# Patient Record
Sex: Male | Born: 1985 | Race: White | Hispanic: No | Marital: Married | Smoking: Never smoker
Health system: Southern US, Community
[De-identification: ages and names within clinical notes are randomized; demographics above are authoritative.]

---

## 2012-10-22 ENCOUNTER — Ambulatory Visit: Payer: Self-pay | Admitting: Family Medicine

## 2015-11-20 ENCOUNTER — Ambulatory Visit (INDEPENDENT_AMBULATORY_CARE_PROVIDER_SITE_OTHER): Payer: 59

## 2015-11-20 ENCOUNTER — Encounter: Payer: Self-pay | Admitting: Gynecology

## 2015-11-20 ENCOUNTER — Ambulatory Visit
Admission: EM | Admit: 2015-11-20 | Discharge: 2015-11-20 | Disposition: A | Discharge status: Home / Self Care | Payer: 59 | Attending: Family Medicine | Admitting: Family Medicine

## 2015-11-20 DIAGNOSIS — J209 Acute bronchitis, unspecified: Secondary | ICD-10-CM

## 2015-11-20 DIAGNOSIS — R0602 Shortness of breath: Secondary | ICD-10-CM

## 2015-11-20 DIAGNOSIS — H66001 Acute suppurative otitis media without spontaneous rupture of ear drum, right ear: Secondary | ICD-10-CM

## 2015-11-20 LAB — RAPID INFLUENZA A&B ANTIGENS: Influenza A (ARMC): NOT DETECTED

## 2015-11-20 LAB — RAPID INFLUENZA A&B ANTIGENS (ARMC ONLY): INFLUENZA B (ARMC): NOT DETECTED

## 2015-11-20 MED ORDER — ONDANSETRON 8 MG PO TBDP
8.0000 mg | ORAL_TABLET | Freq: Once | ORAL | Status: AC
  Administered 2015-11-20: 8 mg via ORAL

## 2015-11-20 MED ORDER — ALBUTEROL SULFATE HFA 108 (90 BASE) MCG/ACT IN AERS: 2.0000 | INHALATION_SPRAY | RESPIRATORY_TRACT | Status: DC | PRN

## 2015-11-20 MED ORDER — FEXOFENADINE-PSEUDOEPHED ER 180-240 MG PO TB24: 1.0000 | ORAL_TABLET | Freq: Every day | ORAL | Status: DC

## 2015-11-20 MED ORDER — METHYLPREDNISOLONE SODIUM SUCC 125 MG IJ SOLR
125.0000 mg | Freq: Once | INTRAMUSCULAR | Status: AC
  Administered 2015-11-20: 125 mg via INTRAMUSCULAR

## 2015-11-20 MED ORDER — CEFUROXIME AXETIL 500 MG PO TABS: 500.0000 mg | ORAL_TABLET | Freq: Two times a day (BID) | ORAL | Status: DC

## 2015-11-20 MED ORDER — IPRATROPIUM-ALBUTEROL 0.5-2.5 (3) MG/3ML IN SOLN
3.0000 mL | Freq: Once | RESPIRATORY_TRACT | Status: AC
  Administered 2015-11-20: 3 mL via RESPIRATORY_TRACT

## 2015-11-20 MED ORDER — HYDROCOD POLST-CPM POLST ER 10-8 MG/5ML PO SUER: 5.0000 mL | Freq: Two times a day (BID) | ORAL | Status: DC | PRN

## 2015-11-20 NOTE — Discharge Instructions (Signed)
Otitis Media, Adult Otitis media is redness, soreness, and puffiness (swelling) in the space just behind your eardrum (middle ear). It may be caused by allergies or infection. It often happens along with a cold. HOME CARE  Take your medicine as told. Finish it even if you start to feel better.  Only take over-the-counter or prescription medicines for pain, discomfort, or fever as told by your doctor.  Follow up with your doctor as told. GET HELP IF:  You have otitis media only in one ear, or bleeding from your nose, or both.  You notice a lump on your neck.  You are not getting better in 3-5 days.  You feel worse instead of better. GET HELP RIGHT AWAY IF:   You have pain that is not helped with medicine.  You have puffiness, redness, or pain around your ear.  You get a stiff neck.  You cannot move part of your face (paralysis).  You notice that the bone behind your ear hurts when you touch it. MAKE SURE YOU:   Understand these instructions.  Will watch your condition.  Will get help right away if you are not doing well or get worse.   This information is not intended to replace advice given to you by your health care provider. Make sure you discuss any questions you have with your health care provider.   Document Released: 03/08/2008 Document Revised: 10/11/2014 Document Reviewed: 04/17/2013 Elsevier Interactive Patient Education 2016 [employer]  Acute Bronchitis Bronchitis is when the airways that extend from the windpipe into the lungs get red, puffy, and painful (inflamed). Bronchitis often causes thick spit (mucus) to develop. This leads to a cough. A cough is the most common symptom of bronchitis. In acute bronchitis, the condition usually begins suddenly and goes away over time (usually in 2 weeks). Smoking, allergies, and asthma can make bronchitis worse. Repeated episodes of bronchitis may cause more lung problems. HOME CARE  Rest.  Drink enough fluids to  keep your pee (urine) clear or pale yellow (unless you need to limit fluids as told by your doctor).  Only take over-the-counter or prescription medicines as told by your doctor.  Avoid smoking and secondhand smoke. These can make bronchitis worse. If you are a smoker, think about using nicotine gum or skin patches. Quitting smoking will help your lungs heal faster.  Reduce the chance of getting bronchitis again by:  Washing your hands often.  Avoiding people with cold symptoms.  Trying not to touch your hands to your mouth, nose, or eyes.  Follow up with your doctor as told. GET HELP IF: Your symptoms do not improve after 1 week of treatment. Symptoms include:  Cough.  Fever.  Coughing up thick spit.  Body aches.  Chest congestion.  Chills.  Shortness of breath.  Sore throat. GET HELP RIGHT AWAY IF:   You have an increased fever.  You have chills.  You have severe shortness of breath.  You have bloody thick spit (sputum).  You throw up (vomit) often.  You lose too much body fluid (dehydration).  You have a severe headache.  You faint. MAKE SURE YOU:   Understand these instructions.  Will watch your condition.  Will get help right away if you are not doing well or get worse.   This information is not intended to replace advice given to you by your health care provider. Make sure you discuss any questions you have with your health care provider.   Document Released: 03/08/2008  Document Revised: 05/23/2013 Document Reviewed: 03/13/2013 Elsevier Interactive Patient Education [employer]

## 2015-11-20 NOTE — ED Provider Notes (Signed)
CSN: [id number]     Arrival date & time 11/20/15  1821 History   First MD Initiated Contact with Patient 11/20/15 2049    Nurses notes were reviewed. Chief Complaint  Patient presents with  . Cough  . Shortness of Breath   Patient has shortness of breath that started this afternoon while at work. According to his wife he's been sick now for about 3 days everything started on him on Sunday regressed as far as ear pain and now shortness of breath. On Tuesday his right ear started bothering him is at low ahead a lot aching percent postnasal drainage and sinus issues. Yesterday his wifeand having more snoring and difficult to breathing at night, this afternoon he became short of breath. He had a physical last month and pulse ox was 100% then insulin 96 today.  No history history of asthma but he does still at night he never smoked no significant family medical history present. He denies any fever.   (Consider location/radiation/quality/duration/timing/severity/associated sxs/prior Treatment) Patient is a 30 y.o. male presenting with cough and shortness of breath. The history is provided by the patient. No language interpreter was used.  Cough Cough characteristics:  Non-productive Severity:  Moderate Onset quality:  Sudden Timing:  Constant Progression:  Worsening Chronicity:  New Context: upper respiratory infection   Context: not sick contacts and not smoke exposure   Relieved by:  Nothing Exacerbated by: coughing. Ineffective treatments:  None tried Associated symptoms: ear pain, rhinorrhea, shortness of breath, sinus congestion and wheezing   Risk factors: recent infection   Shortness of Breath Associated symptoms: cough, ear pain and wheezing     History reviewed. No pertinent past medical history. History reviewed. No pertinent past surgical history. No family history on file. Social History  Substance Use Topics  . Smoking status: Never Smoker   . Smokeless tobacco: None  .  Alcohol Use: Yes    Review of Systems  HENT: Positive for ear pain and rhinorrhea.   Respiratory: Positive for cough, shortness of breath and wheezing.   All other systems reviewed and are negative.   Allergies  Review of patient's allergies indicates no known allergies.  Home Medications   Prior to Admission medications   Medication Sig Start Date End Date Taking? Authorizing Provider  albuterol (PROVENTIL HFA;VENTOLIN HFA) 108 (90 Base) MCG/ACT inhaler Inhale 2 puffs into the lungs every 4 (four) hours as needed for wheezing or shortness of breath. 11/20/15   Hassan Rowan, MD  cefUROXime (CEFTIN) 500 MG tablet Take 1 tablet (500 mg total) by mouth 2 (two) times daily. 11/20/15   Hassan Rowan, MD  chlorpheniramine-HYDROcodone [facility] PENNKINETIC ER) 10-8 MG/5ML SUER Take 5 mLs by mouth every 12 (twelve) hours as needed for cough. 11/20/15   Hassan Rowan, MD  fexofenadine-pseudoephedrine (ALLEGRA-D ALLERGY & CONGESTION) 180-240 MG 24 hr tablet Take 1 tablet by mouth daily. 11/20/15   Hassan Rowan, MD   Meds Ordered and Administered this Visit   Medications  methylPREDNISolone sodium succinate (SOLU-MEDROL) 125 mg/2 mL injection 125 mg (not administered)  ipratropium-albuterol (DUONEB) 0.5-2.5 (3) MG/3ML nebulizer solution 3 mL (3 mLs Nebulization Given 11/20/15 2147)    BP 103/65 mmHg  Pulse 96  Temp(Src) 99.1 F (37.3 C) (Oral)  Resp 18  Ht 6' (1.829 m)  Wt 185 lb (83.915 kg)  BMI 25.08 kg/m2  SpO2 94% No data found.   Physical Exam  Constitutional: He is oriented to person, place, and time. He appears well-developed and well-nourished.  HENT:  Head: Normocephalic and atraumatic.  Right Ear: Hearing and external ear normal. Tympanic membrane is erythematous and retracted.  Left Ear: Hearing, tympanic membrane, external ear and ear canal normal.  Nose: Mucosal edema and rhinorrhea present. Right sinus exhibits maxillary sinus tenderness. Right sinus exhibits no frontal sinus  tenderness. Left sinus exhibits maxillary sinus tenderness. Left sinus exhibits no frontal sinus tenderness.  Mouth/Throat: Normal dentition. No dental caries. Posterior oropharyngeal erythema present.  Eyes: Conjunctivae are normal. Pupils are equal, round, and reactive to light.  Neck: Normal range of motion. Neck supple.  Cardiovascular: Normal rate and regular rhythm.   Pulmonary/Chest: No respiratory distress. He has decreased breath sounds. He has no wheezes.  Musculoskeletal: Normal range of motion.  Lymphadenopathy:    He has cervical adenopathy.  Neurological: He is alert and oriented to person, place, and time.  Skin: Skin is warm. Rash noted.  Facial seborrheic dermatitis  Psychiatric: He has a normal mood and affect.  Vitals reviewed.   ED Course  Procedures (including critical care time)  Labs Review Labs Reviewed  RAPID INFLUENZA A&B ANTIGENS [facility] ONLY)    Imaging Review Dg Chest 2 View  11/20/2015  CLINICAL DATA:  Shortness of breath. EXAM: CHEST  2 VIEW COMPARISON:  None. FINDINGS: The heart size and mediastinal contours are within normal limits. Both lungs are clear. No pneumothorax or pleural effusion is noted. The visualized skeletal structures are unremarkable. IMPRESSION: No active cardiopulmonary disease. Electronically Signed   By: Lupita Raider, M.D.   On: 11/20/2015 21:40     Visual Acuity Review  Right Eye Distance:   Left Eye Distance:   Bilateral Distance:    Right Eye Near:   Left Eye Near:    Bilateral Near:      Results for orders placed or performed during the hospital encounter of 11/20/15  Rapid Influenza A&B Antigens ([facility] only)  Result Value Ref Range   Influenza A ([facility]) NOT DETECTED    Influenza B ([facility]) NOT DETECTED      MDM   1. Acute suppurative otitis media of right ear without spontaneous rupture of tympanic membrane, recurrence not specified   2. Bronchitis, acute, with bronchospasm   3. Shortness of breath       DuoNeb was given with no improvement of the oxygenation level. Because of the poor oxygenation improvement SciMed L1 25 IM was also given. Chest x-ray showed no signs of infiltrate. Will treat bronchitis with Ceftin 500 Tussionex 1 teaspoon twice daily, albuterol inhaler. Also placed on Allegra-D and decongestant. Off work note for tomorrow and have patient follow-up with PCP if not better.    Hassan Rowan, MD 11/20/15 2218

## 2015-11-20 NOTE — ED Notes (Signed)
Patient c/o coughing / shortness of breath/ sneezing and right ear pain.

## 2015-11-20 NOTE — ED Notes (Signed)
Pt with nausea and emesis.

## 2018-07-27 ENCOUNTER — Other Ambulatory Visit: Payer: Self-pay | Admitting: Family Medicine

## 2018-07-27 DIAGNOSIS — R1011 Right upper quadrant pain: Secondary | ICD-10-CM

## 2018-08-02 ENCOUNTER — Ambulatory Visit
Admission: RE | Admit: 2018-08-02 | Discharge: 2018-08-02 | Disposition: A | Discharge status: Home / Self Care | Payer: BLUE CROSS/BLUE SHIELD | Source: Ambulatory Visit | Attending: Family Medicine | Admitting: Family Medicine

## 2018-08-02 DIAGNOSIS — R1011 Right upper quadrant pain: Secondary | ICD-10-CM | POA: Diagnosis not present

## 2018-08-30 ENCOUNTER — Ambulatory Visit: Payer: 59

## 2019-01-07 IMAGING — US US ABDOMEN LIMITED
1 series · 14 of 25 positions shown · non-contrast
Comparison: None.

CLINICAL DATA: 32-year-old male with RIGHT UPPER quadrant abdominal
pain for 2 months.

EXAM:
ULTRASOUND ABDOMEN LIMITED RIGHT UPPER QUADRANT

[Series 1: us abdomen limited · 0.26mm/px · 14 of 49 slices shown]
[im 1/49]
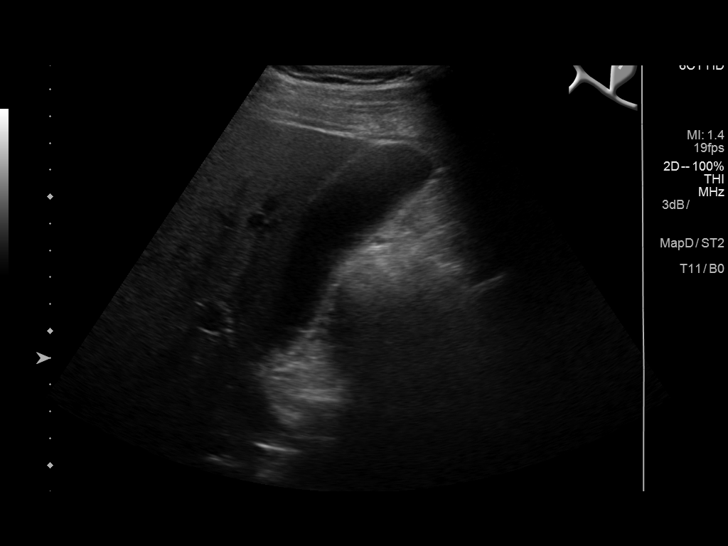
[im 5/49]
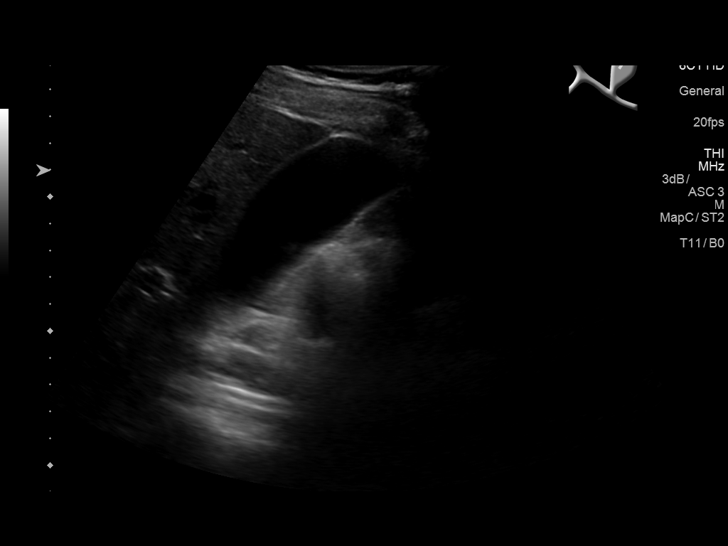
[im 9/49]
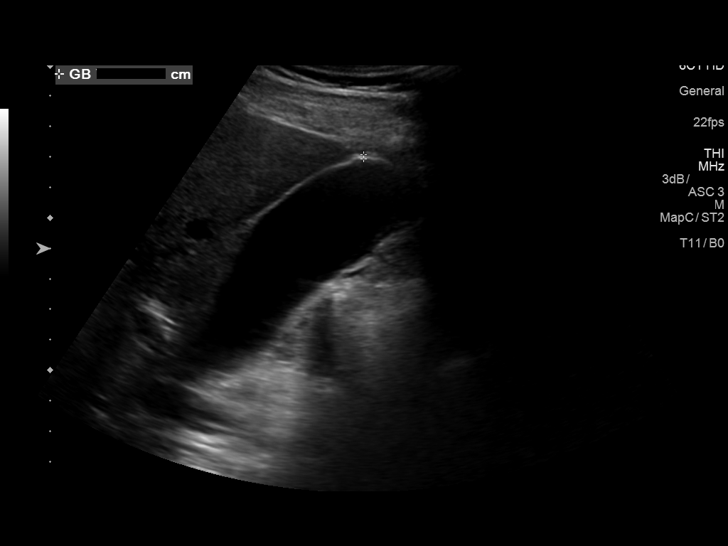
[im 13/49]
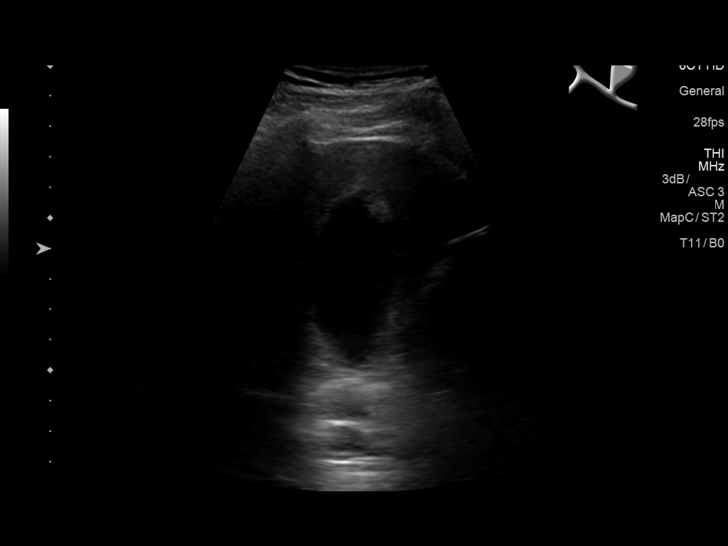
[im 17/49]
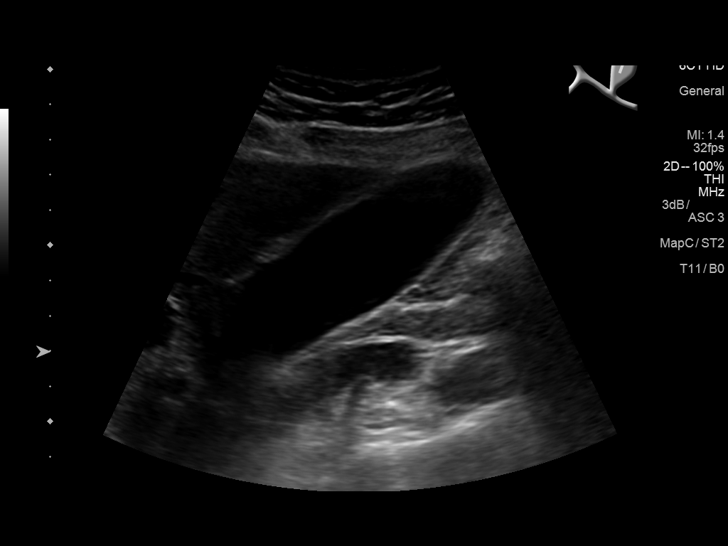
[im 19/49]
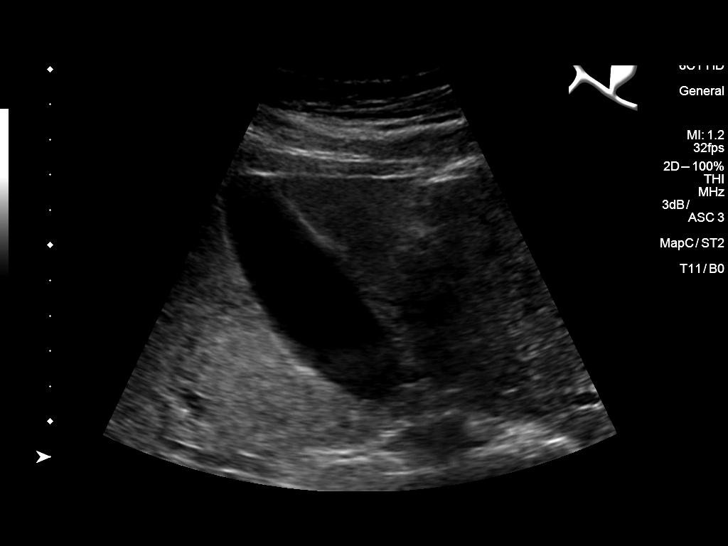
[im 23/49]
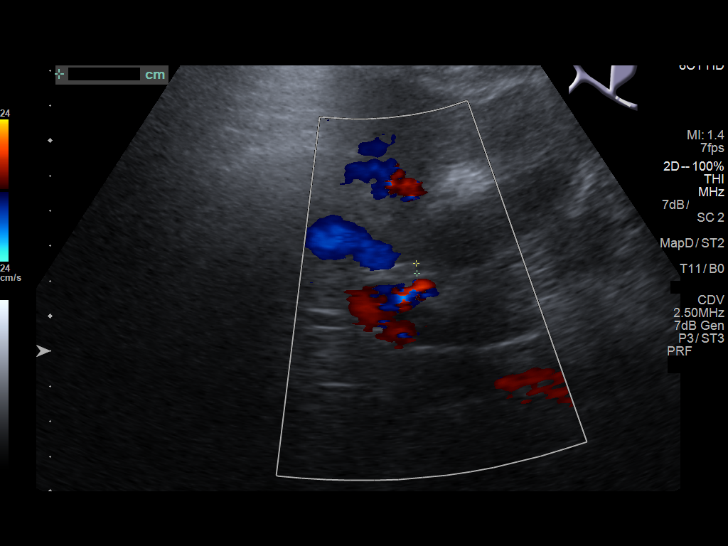
[im 27/49]
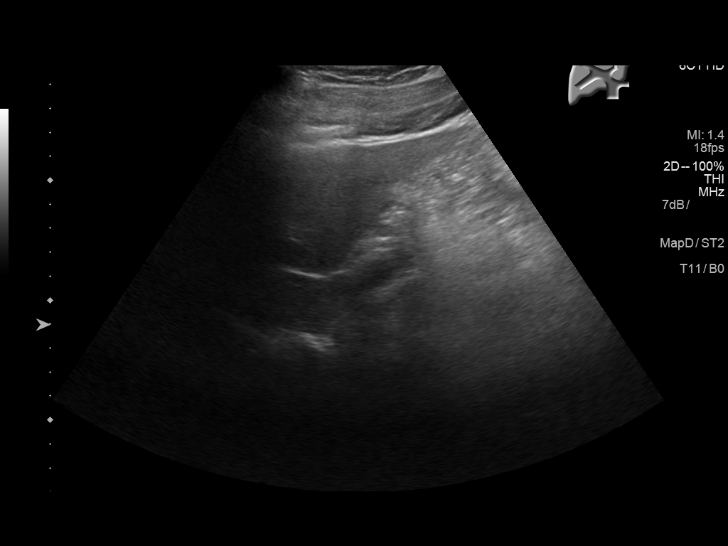
[im 31/49]
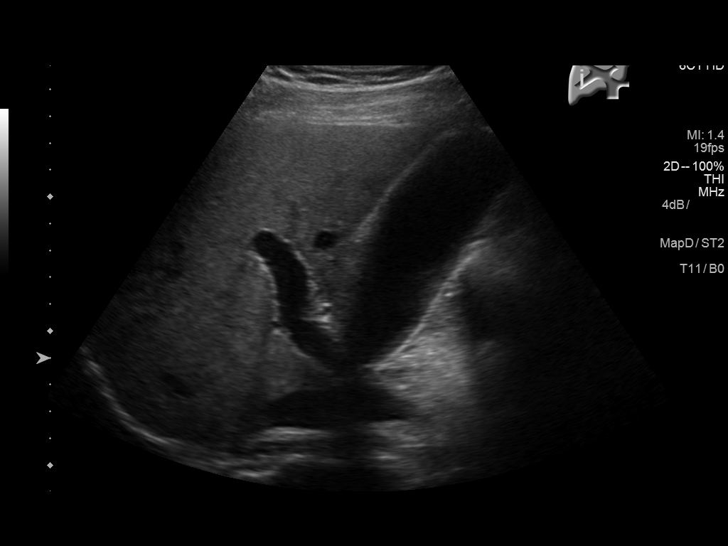
[im 33/49]
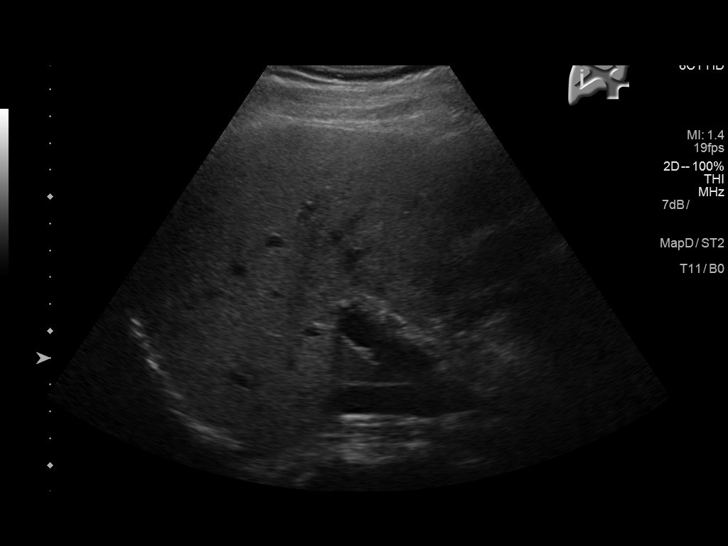
[im 37/49]
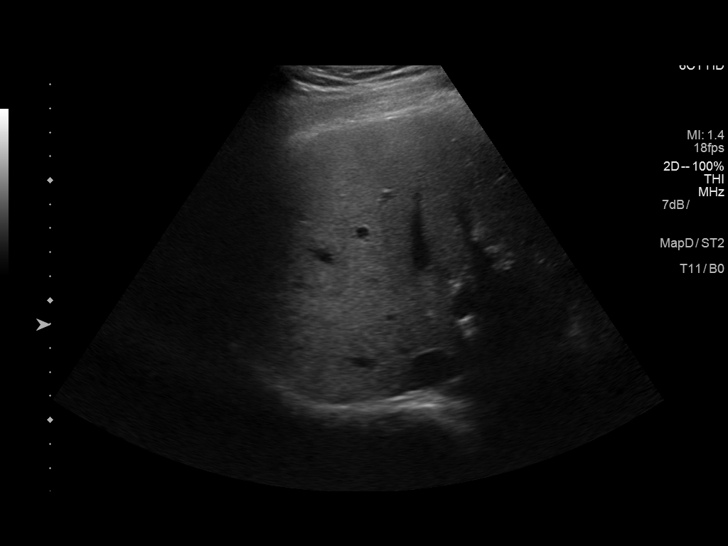
[im 41/49]
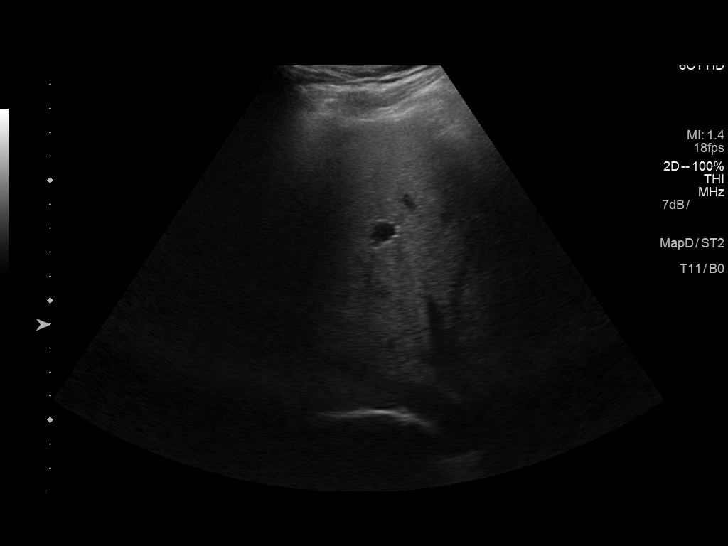
[im 45/49]
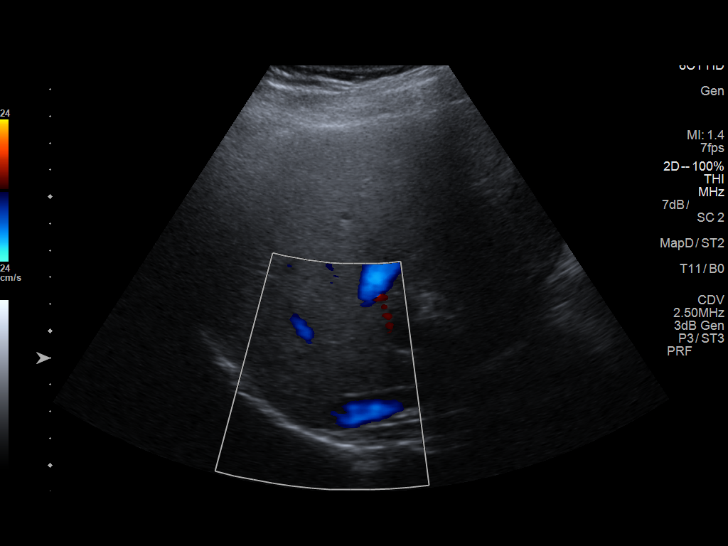
[im 49/49]
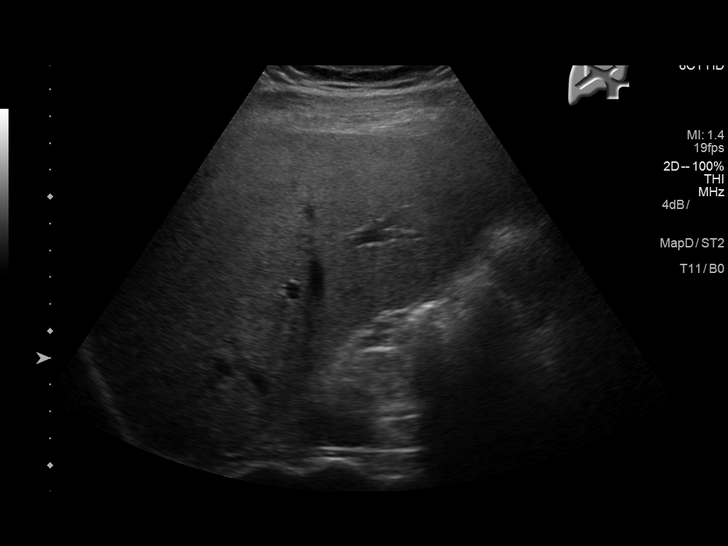

[14 of 25 positions shown; findings below may reference images not displayed]

FINDINGS: Gallbladder:

The gallbladder is unremarkable. There is no evidence of
cholelithiasis or acute cholecystitis.

Common bile duct:

Diameter: 2.8 mm. No intrahepatic or extrahepatic biliary
dilatation.

Liver:

Slightly increased hepatic echogenicity which may represent mild
hepatic steatosis. No focal hepatic abnormalities are identified.
Portal vein is patent on color Doppler imaging with normal direction
of blood flow towards the liver.
IMPRESSION: 1. Possible mild hepatic steatosis. Otherwise unremarkable RIGHT
UPPER quadrant abdominal ultrasound.

## 2020-05-14 ENCOUNTER — Encounter: Payer: Self-pay | Admitting: Emergency Medicine

## 2020-05-14 ENCOUNTER — Ambulatory Visit
Admission: EM | Admit: 2020-05-14 | Discharge: 2020-05-14 | Disposition: A | Discharge status: Home / Self Care | Payer: BC Managed Care – PPO | Attending: Family Medicine | Admitting: Family Medicine

## 2020-05-14 ENCOUNTER — Other Ambulatory Visit: Payer: Self-pay

## 2020-05-14 DIAGNOSIS — B354 Tinea corporis: Secondary | ICD-10-CM | POA: Diagnosis not present

## 2020-05-14 MED ORDER — FLUCONAZOLE 150 MG PO TABS: 150.0000 mg | ORAL_TABLET | ORAL | 0 refills | Status: AC

## 2020-05-14 NOTE — ED Provider Notes (Signed)
[facility]    CSN: [id number] Arrival date & time: 05/14/20  1000      History   Chief Complaint Chief Complaint  Patient presents with   Tinea   HPI  34 year old male presents with suspected ringworm.  Patient reports a rash to his anterior neck for the past week.  No itching.  No pain.  He states that it appears to be consistent with ringworm.  Seems to be spreading around the neck.  No medications or interventions tried.  No relieving factors.  No other associated symptoms.  No other complaints.  Home Medications    Prior to Admission medications   Medication Sig Start Date End Date Taking? Authorizing Provider  fluconazole (DIFLUCAN) 150 MG tablet Take 1 tablet (150 mg total) by mouth once a week. Repeat dose in 72 hours. 05/14/20   Tommie Sams, DO  albuterol (PROVENTIL HFA;VENTOLIN HFA) 108 (90 Base) MCG/ACT inhaler Inhale 2 puffs into the lungs every 4 (four) hours as needed for wheezing or shortness of breath. 11/20/15 05/14/20  Hassan Rowan, MD    Family History History reviewed. No pertinent family history.  Social History Social History   Tobacco Use   Smoking status: Never Smoker   Smokeless tobacco: Never Used  Building services engineer Use: Never used  Substance Use Topics   Alcohol use: Yes   Drug use: No     Allergies   Patient has no known allergies.   Review of Systems Review of Systems  Skin: Positive for rash.   Physical Exam Triage Vital Signs ED Triage Vitals  Enc Vitals Group     BP 05/14/20 1059 118/80     Pulse Rate 05/14/20 1059 66     Resp 05/14/20 1059 18     Temp 05/14/20 1059 98.2 F (36.8 C)     Temp Source 05/14/20 1059 Oral     SpO2 05/14/20 1059 100 %     Weight 05/14/20 1058 215 lb (97.5 kg)     Height 05/14/20 1058 6' (1.829 m)     Head Circumference --      Peak Flow --      Pain Score 05/14/20 1057 0     Pain Loc --      Pain Edu? --      Excl. in GC? --    Updated Vital Signs BP 118/80 (BP  Location: Right Arm)    Pulse 66    Temp 98.2 F (36.8 C) (Oral)    Resp 18    Ht 6' (1.829 m)    Wt 97.5 kg    SpO2 100%    BMI 29.16 kg/m   Visual Acuity Right Eye Distance:   Left Eye Distance:   Bilateral Distance:    Right Eye Near:   Left Eye Near:    Bilateral Near:     Physical Exam Constitutional:      General: He is not in acute distress.    Appearance: Normal appearance. He is not ill-appearing.  HENT:     Head: Normocephalic and atraumatic.  Eyes:     General:        Right eye: No discharge.        Left eye: No discharge.     Conjunctiva/sclera: Conjunctivae normal.  Neck:      Comments: Erythematous, Circular rash with raised order noted.  Pulmonary:     Effort: Pulmonary effort is normal. No respiratory distress.  Neurological:  Mental Status: He is alert.  Psychiatric:        Mood and Affect: Mood normal.        Behavior: Behavior normal.    UC Treatments / Results  Labs (all labs ordered are listed, but only abnormal results are displayed) Labs Reviewed - No data to display  EKG   Radiology No results found.  Procedures Procedures (including critical care time)  Medications Ordered in UC Medications - No data to display  Initial Impression / Assessment and Plan / UC Course  I have reviewed the triage vital signs and the nursing notes.  Pertinent labs & imaging results that were available during my care of the patient were reviewed by me and considered in my medical decision making (see chart for details).    34 year old male presents with tinea corporis. Treating with Diflucan.   Final Clinical Impressions(s) / UC Diagnoses   Final diagnoses:  Tinea corporis   Discharge Instructions   None    ED Prescriptions    Medication Sig Dispense Auth. Provider   fluconazole (DIFLUCAN) 150 MG tablet Take 1 tablet (150 mg total) by mouth once a week. Repeat dose in 72 hours. 4 tablet Tommie Sams, DO     PDMP not reviewed this  encounter.   Tommie Sams, [place] 05/14/20 1204

## 2020-05-14 NOTE — ED Triage Notes (Signed)
Patient c/o rash to his neck area x 1 week. He denies pain and denies itching.
